# Patient Record
Sex: Male | Born: 1985 | Race: White | Hispanic: No | Marital: Single | Smoking: Never smoker
Health system: Southern US, Academic
[De-identification: ages and names within clinical notes are randomized; demographics above are authoritative.]

---
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
## 2022-01-15 ENCOUNTER — Encounter (HOSPITAL_COMMUNITY): Payer: Self-pay

## 2022-01-15 ENCOUNTER — Ambulatory Visit (HOSPITAL_COMMUNITY): Payer: Self-pay

## 2022-02-06 ENCOUNTER — Ambulatory Visit (HOSPITAL_COMMUNITY)
Admission: RE | Admit: 2022-02-06 | Discharge: 2022-02-06 | Disposition: A | Discharge status: Still In Hospital | Payer: Auto Insurance (includes no fault) | Source: Ambulatory Visit

## 2022-02-06 ENCOUNTER — Other Ambulatory Visit: Payer: Self-pay

## 2022-02-06 DIAGNOSIS — S161XXD Strain of muscle, fascia and tendon at neck level, subsequent encounter: Secondary | ICD-10-CM | POA: Insufficient documentation

## 2022-02-06 DIAGNOSIS — M5416 Radiculopathy, lumbar region: Secondary | ICD-10-CM | POA: Insufficient documentation
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
## 2022-02-12 ENCOUNTER — Other Ambulatory Visit: Payer: Self-pay

## 2022-02-12 ENCOUNTER — Ambulatory Visit (HOSPITAL_COMMUNITY)
Admission: RE | Admit: 2022-02-12 | Discharge: 2022-02-12 | Disposition: A | Discharge status: Still In Hospital | Payer: Auto Insurance (includes no fault) | Source: Ambulatory Visit

## 2022-02-12 NOTE — PT Treatment (Signed)
[facility]  Outpatient Physical Therapy  [place], [address]  [phone]  [phone]    Physical Therapy Treatment Note    Date: 02/12/2022  Patient's Name: Jacob Scott.  Date of Birth: [birth year]            Visit #/POC: 2 / up to 33  Authorization:  POC Signed?:  yes  POC Ends: 10 / 21/23  Next Progress Note Due:       Evaluating Physical Therapist: Darrol Jump MSPT  PT diagnosis/Reason for Referral: M54.16 Lumbar radiculopathy  S16.1XXA Cervical strain, acute, initial encounter  Next Scheduled Physician Appointment: no follow up appointment scheduled.   Allergies/Contraindications:           Subjective: Rates pain 7/10 today.  States painful entire spine.  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  No changes after evaluation last visit.  States he does have trouble sleeping.     Objective: Warm up on Nustep followed by activities noted below.        EXERCISE/ACTIVITY NAME REPETITIONS RESISTANCE COMPLETED THIS DOS   Nustep    5 min Level 3 yes    Supine trunk rotation with anterior chest stretch   10 x 10 sec each side na Yes; HEP 02/12/22   Supine chin tuck    Supine scalene stretch   5 sec x 10     5 sec x 10 na Yes; HEP 02/12/22  Yes; HEP 02/12/22   Open book L/R   10 each sidelying na Yes; HEP 02/12/22   Arm on ball for side glide and rotations   10 x each na yes   Seated trunk extension over ball   10 na yes                             Access Code: [email or web address]  URL: [email or web address]  Date: 02/12/2022  Prepared by: Mearl Latin Shandon Matson    Exercises  - Supine Posterior Scalene Stretch  - 2 x daily - 7 x weekly - 1 sets - 10 reps - 10 sec hold  - Supine Chin Tuck  - 2 x daily - 7 x weekly - 1 sets - 10 reps - 10 sec hold  - Supine Lower Trunk Rotation  - 2 x daily - 7 x weekly - 1 sets - 10 reps - 10 sec hold  - Sidelying Thoracic Rotation with Open Book  - 2 x daily - 7 x weekly - 1 sets - 10 reps - 10 sec hold    Assessment: Pt reports no  change in pain after session.  Transitions are smooth but slow.  Pt with very flat affect but does report pain with activity.  Good ROM noted with stretches but pt does report pain.     Plan: Will continue and progress as tolerated.  Pt does note he may only be able to attend therapy 1x/week d/t his work schedule        Short Term Goals: 3 Weeks              - Pt will participate in a HEP agreed upon in therapy.              - Pt will report pain has localized to a particular cervical spinal level, thoracic level, and/or lumbar level.              - Pt will report  no pain (only tenderness) with palpation of muscles surrounding the cervical, thoracic, and lumbar area.              - Pt will report an improvement in his functional skills by an improve in his PSFS average to 5.        Long Term Goals: 6  Weeks              - Pt will participate in all therapy activities in the clinic with pain level increasing no greater than 2 points from the time he starts therapy to the end of the session.              - Pt will report pain has localized to the lumbar spine and reports no radiculopathy into his legs.              - Pt will report no pain (only tenderness) with palpation of muscles surrounding the cervical, thoracic, and lumbar are              - Pt will report an improvement in his functional skills by an improve in his PSFS average to 7.              - Pt will improve his MMT of all muscle groups including UE and LE bilaterally to 4/5 without pain.        Total Session Time 30 and Timed code minutes 30  THERAPEUTIC EXERCISE 30 minutes      Rye Decoste, PTA  02/12/2022, 17:09

## 2022-02-14 ENCOUNTER — Ambulatory Visit (HOSPITAL_COMMUNITY): Payer: Self-pay

## 2022-02-21 ENCOUNTER — Ambulatory Visit (HOSPITAL_COMMUNITY): Payer: Self-pay

## 2022-02-23 ENCOUNTER — Ambulatory Visit (HOSPITAL_COMMUNITY): Payer: Self-pay

## 2022-03-02 ENCOUNTER — Other Ambulatory Visit: Payer: Self-pay

## 2022-03-02 ENCOUNTER — Ambulatory Visit
Admission: RE | Admit: 2022-03-02 | Discharge: 2022-03-02 | Disposition: A | Discharge status: Still In Hospital | Payer: Auto Insurance (includes no fault) | Source: Ambulatory Visit

## 2022-03-02 NOTE — PT Treatment (Signed)
[facility] Medicine [facility]  Outpatient Physical Therapy  [address]  [place], [address]  [phone]  (Fax) [phone]    Physical Therapy Treatment Note    Date: 03/02/2022  Patient's Name: Jacob Scott.  Date of Birth: [birth year]              Visit #/POC: 3 / up to 12  Authorization:  POC Signed?:  yes  POC Ends: 10 / 31/23  Next Progress Note Due:         Evaluating Physical Therapist: Oliver Hum MSPT  PT diagnosis/Reason for Referral: M54.16 Lumbar radiculopathy  S16.1XXA Cervical strain, acute, initial encounter  Next Scheduled Physician Appointment: no follow up appointment scheduled.   Allergies/Contraindications:         Subjective: States exercise has made him worse.  Notes he is trying to do exercises at home but notes they do cause pain.  Pt states he continues to work and that causes pain as well.  Does not have follow up appointment with doctor at this time.  States has missed almost 3 weeks of therapy d/t work.  Rates pain 8/10 today.  Notes neck, upper and lower back pain    Objective: Warm up on Nustep.  Review of HEP, discussed importance of consistent attendance and importance of completing HEP      AROM lumbar spine                                                              03/02/22    right left  R    /   L   Flexion 21 cm to the floor  25 cm to floor   Extension 10 degrees  15 degrees   Sidebend 54 inches from floor 51.5 cm to the floor  49cm /  51cm      AROM cervical spine                                                                03/02/22    right left    R  /   L   Flexion 25 degrees      35   Extension 54 degrees       45   Sidebend 25 degrees 25 degrees   30 /  38   Rotation 55 degrees 50 degrees   45 /  50        EXERCISE/ACTIVITY NAME REPETITIONS RESISTANCE COMPLETED THIS DOS   Nustep     5 min Level 3 yes    Supine trunk rotation with anterior chest stretch    10 x 10 sec each side Na (cues) Yes; HEP 02/12/22   Supine chin tuck     Supine  scalene stretch    5 sec x 10      5 sec x 10 Na (cues) Yes; HEP 02/12/22  Yes; HEP 02/12/22   Open book L/R    10 each sidelying Na (cues Yes; HEP 02/12/22   Arm on ball for side glide and rotations  10 x each na  no   Seated trunk extension over ball    10 na  no                                               Assessment: Pt did come into therapy today straight from work.  ROM noted to have some improvement however some measurements were worse.  Pt has smooth transitions and minimal pain behaviors.  Of note he cannot recall correct form for any of his home exercises.  Poor attendance does contribute to his limited progress.      Short Term Goals: 3 Weeks              - Pt will participate in a HEP agreed upon in therapy.              - Pt will report pain has localized to a particular cervical spinal level, thoracic level, and/or lumbar level.              - Pt will report no pain (only tenderness) with palpation of muscles surrounding the cervical, thoracic, and lumbar area.              - Pt will report an improvement in his functional skills by an improve in his PSFS average to 5.        Long Term Goals: 6  Weeks              - Pt will participate in all therapy activities in the clinic with pain level increasing no greater than 2 points from the time he starts therapy to the end of the session.              - Pt will report pain has localized to the lumbar spine and reports no radiculopathy into his legs.              - Pt will report no pain (only tenderness) with palpation of muscles surrounding the cervical, thoracic, and lumbar are              - Pt will report an improvement in his functional skills by an improve in his PSFS average to 7.              - Pt will improve his MMT of all muscle groups including UE and LE bilaterally to 4/5 without pain.             Plan:   PT will need to reassess pt to determine need to extend POC    Total Session Time 30 and Timed code minutes 30  THERAPEUTIC EXERCISE 30  minutes      Hevin Jeffcoat, PTA  03/02/2022, 17:09

## 2022-03-12 ENCOUNTER — Ambulatory Visit (HOSPITAL_COMMUNITY): Payer: Self-pay

## 2022-03-15 ENCOUNTER — Ambulatory Visit (HOSPITAL_COMMUNITY): Payer: Self-pay

## 2022-03-20 ENCOUNTER — Other Ambulatory Visit: Payer: Self-pay

## 2022-03-20 ENCOUNTER — Ambulatory Visit
Admission: RE | Admit: 2022-03-20 | Discharge: 2022-03-20 | Disposition: A | Discharge status: Still In Hospital | Payer: Self-pay | Source: Ambulatory Visit

## 2022-03-20 DIAGNOSIS — S161XXD Strain of muscle, fascia and tendon at neck level, subsequent encounter: Secondary | ICD-10-CM | POA: Insufficient documentation

## 2022-03-20 DIAGNOSIS — M5416 Radiculopathy, lumbar region: Secondary | ICD-10-CM | POA: Insufficient documentation

## 2022-03-20 NOTE — PT Treatment (Signed)
[facility]  Outpatient Physical Therapy  [place], [address]  [phone]  [phone]    Physical Therapy Treatment Note  DISCHARGE SUMMARY    Date: 03/20/2022  Patient's Name: Jacob Scott.  Date of Birth: 03/02/1986    Visit #/POC: 4/ up to 12  Authorization:  POC Signed?:  yes  POC Ends: 10 / 31/23  Next Progress Note Due: 11/31/2023     Evaluating Physical Therapist: Darrol Jump MSPT  PT diagnosis/Reason for Referral: M54.16 Lumbar radiculopathy  S16.1XXA Cervical strain, acute, initial encounter  Next Scheduled Physician Appointment: no follow up appointment scheduled.   Allergies/Contraindications:          Subjective: Pt reports that it hurts when he tries to do his exercises but the exercises hurt.  He tries to do the exercises in the evenings when he gets home from work.   He misses a lot of therapy d/t work.  Rates pain 8/10 today.  Notes neck, upper and lower back pain.  Pt attended the evaluation on 02/06/2022, once the week of 02/12/2022, did not attend the next week, attended once the week of 02/26/2022, and then returned for therapy today.  Pt likes the MHP and e-stim but today is the first session we have done that this episode of care.     Patient-Specific Functional Score:  Problem Score at eval 03/20/2022   1. Drive without pain 3 3   2. Work at a normal pace 3 3   3. Sleep all night 3 3   Total 3 3         Objective:     AROM lumbar spine   At evaluation 03/02/2022 03/20/2022     right left     Flexion 21 cm to the floor 25 cm to floor 26 cm to floor    Extension 10 degrees 15 degrees 14 degrees   Sidebend 54 inches from floor 51.5 cm to the floor R = 49 cm   L = 51cm B each side = 49 cm to the floor         AROM cervical spine   At evaluation 03/02/2022 03/20/2022     right left     Flexion 25 degrees   35 degrees 18 degrees   Extension 54 degrees    45 degrees 48 degrees   Sidebend 25 degrees 25 degrees R = 30 degrees  L =  38 degrees R = 25 degrees L = 20 degrees   Rotation 55 degrees 50 degrees R= 45 degrees  L = 50 degrees R = 60 degrees  L = 50 degrees      ROM comments (at the evaluation and today) -Pt reports all active movements hurt in the middle of his neck and the middle of his low back     Strength   At evaluation  03/20/2022     right left     Cervical flexion 3/5  3/5   Cervical extension 3/5  3/5   Cervical side bending 3/5 3/5  B = 3/5   Cervical rotation  3/5 3/5  B = 3/5   Shoulder flexion 3/5 3/5  B = 3/5   Shoulder abduction 3/5 3/5  B = 3+/5   Hip flexion (L1,2) 3/5 3/5  B = 3/5   Knee flexion (S1) 3/5 3/5  B = 4/5   Knee extension (L2-4) 3/5 3/5  B = 3+/5  At evaluation and today - Strength comments pain with all resisted manual muscle tests.     Palpation at evaluation : pain in all the following muscle groups including scalenes, levator, pec, upper trap, deltoids, rhomboids, cervical paraspinals, thoracic paraspinals, and lumbar paraspinals.  Pain with palpation of c-spine all levels, thoracic spine all levels, and lumbar spine all levels including spinous processes and transverse processes.  Pain with palpation of B PSIS.       EXERCISE/ACTIVITY NAME REPETITIONS RESISTANCE COMPLETED THIS DOS   Nustep     5 min Level 3 no    Supine trunk rotation with anterior chest stretch    10 x 10 sec each side Na (cues) no; HEP 02/12/22   Supine chin tuck     Supine scalene stretch    5 sec x 10      5 sec x 10 Na (cues) no; HEP 02/12/22  Yes; HEP 02/12/22   Open book L/R    10 each sidelying Na (cues no; HEP 02/12/22   Arm on ball for side glide and rotations    10 x each na  no   Seated trunk extension over ball    10 na  no    Reassess for progress note         yes    MHP and pre-modulated e-stim                               Assessment: Pt did come into therapy today straight from work.  ROM noted to be about the same and MMT was slightly improved.   PROGRESS NOTE - Pt has attended 4/12 visits in the past 7 weeks.  Pt  has made little to no progress.  Pt has met 0/4 STGs and 0/5 LTGs.  Pt reports little to no improvement since initial evaluation.  PT and pt discussed that PT services do not appear to be helping pt at this time.  Pt will be discharged due to no change at this time.      Short Term Goals: 3 Weeks              - Pt will participate in a HEP agreed upon in therapy.  PROGRESSING, 03/20/2022              - Pt will report pain has localized to a particular cervical spinal level, thoracic level, and/or lumbar level. NOT MET, 03/20/2022              - Pt will report no pain (only tenderness) with palpation of muscles surrounding the cervical, thoracic, and lumbar area. NOT MET, 03/20/2022              - Pt will report an improvement in his functional skills by an improve in his PSFS average to 5.  NOT MET, 03/20/2022        Long Term Goals: 6  Weeks              - Pt will participate in all therapy activities in the clinic with pain level increasing no greater than 2 points from the time he starts therapy to the end of the session. NOT MET, 03/20/2022              - Pt will report pain has localized to the lumbar spine and reports no radiculopathy into his legs.  NOT MET, 03/20/2022              -  Pt will report no pain (only tenderness) with palpation of muscles surrounding the cervical, thoracic, and lumbar are NOT MET, 03/20/2022              - Pt will report an improvement in his functional skills by an improve in his PSFS average to 7. NOT MET, 03/20/2022              - Pt will improve his MMT of all muscle groups including UE and LE bilaterally to 4/5 without pain. NOT MET, 03/20/2022        Plan:   Discharge at this time due to no change since initial evaluation.  Pt will discuss next steps with his referring doctor.  If that doctor recommends additional PT, recommend doing a new evaluation and plan of care at that time since this POC expires as of today.     Total Session Time 45, Timed code minutes 25, and Untimed  code minutes 20  THERAPEUTIC EXERCISE 25 minutes and ELECTRICAL STIMULATION 20 minutes    Darrol Jump  03/20/2022, 16:40
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
## 2023-03-25 ENCOUNTER — Inpatient Hospital Stay (HOSPITAL_COMMUNITY)
Admission: RE | Admit: 2023-03-25 | Discharge: 2023-03-25 | Disposition: A | Discharge status: Home / Self Care | Payer: BC Managed Care – PPO | Source: Ambulatory Visit

## 2023-03-25 ENCOUNTER — Other Ambulatory Visit: Payer: Self-pay

## 2023-03-25 ENCOUNTER — Other Ambulatory Visit (HOSPITAL_COMMUNITY): Payer: Self-pay | Admitting: Orthopaedic Surgery

## 2023-03-25 ENCOUNTER — Emergency Department
Admission: EM | Admit: 2023-03-25 | Discharge: 2023-03-25 | Disposition: A | Discharge status: Home / Self Care | Payer: BC Managed Care – PPO | Source: Home / Self Care | Attending: Physician Assistant | Admitting: Physician Assistant

## 2023-03-25 ENCOUNTER — Emergency Department (HOSPITAL_COMMUNITY): Payer: BC Managed Care – PPO

## 2023-03-25 ENCOUNTER — Other Ambulatory Visit (HOSPITAL_COMMUNITY): Payer: BC Managed Care – PPO

## 2023-03-25 DIAGNOSIS — W010XXA Fall on same level from slipping, tripping and stumbling without subsequent striking against object, initial encounter: Secondary | ICD-10-CM | POA: Insufficient documentation

## 2023-03-25 DIAGNOSIS — Z01818 Encounter for other preprocedural examination: Secondary | ICD-10-CM | POA: Insufficient documentation

## 2023-03-25 DIAGNOSIS — Z01811 Encounter for preprocedural respiratory examination: Secondary | ICD-10-CM | POA: Insufficient documentation

## 2023-03-25 DIAGNOSIS — S52322A Displaced transverse fracture of shaft of left radius, initial encounter for closed fracture: Secondary | ICD-10-CM | POA: Insufficient documentation

## 2023-03-25 LAB — CBC
HCT: 37.3 % (ref 36.7–47.1)
HGB: 13.1 g/dL (ref 12.5–16.3)
MCH: 29.3 pg (ref 23.8–33.4)
MCHC: 35.1 g/dL (ref 32.5–36.3)
MCV: 83.4 fL (ref 73.0–96.2)
MPV: 8.4 fL (ref 7.4–11.4)
PLATELETS: 231 10*3/uL (ref 140–440)
RBC: 4.46 10*6/uL (ref 4.06–5.63)
RDW: 13.5 % (ref 12.1–16.2)
WBC: 10.1 10*3/uL (ref 3.6–10.2)

## 2023-03-25 LAB — URINALYSIS, MACROSCOPIC
BILIRUBIN: NEGATIVE mg/dL
BLOOD: NEGATIVE mg/dL
GLUCOSE: NEGATIVE mg/dL
KETONES: NEGATIVE mg/dL
LEUKOCYTES: NEGATIVE WBCs/uL
NITRITE: NEGATIVE
PH: 6 (ref 5.0–9.0)
PROTEIN: 10 mg/dL
SPECIFIC GRAVITY: 1.031 — ABNORMAL HIGH (ref 1.002–1.030)
UROBILINOGEN: 2 mg/dL — AB

## 2023-03-25 LAB — BASIC METABOLIC PANEL
ANION GAP: 6 mmol/L (ref 4–13)
BUN/CREA RATIO: 13 (ref 6–22)
BUN: 16 mg/dL (ref 7–25)
CALCIUM: 9.2 mg/dL (ref 8.6–10.3)
CHLORIDE: 102 mmol/L (ref 98–107)
CO2 TOTAL: 29 mmol/L (ref 21–31)
CREATININE: 1.27 mg/dL (ref 0.60–1.30)
ESTIMATED GFR: 76 mL/min/{1.73_m2} (ref >59)
GLUCOSE: 107 mg/dL (ref 74–109)
OSMOLALITY, CALCULATED: 276 mosm/kg (ref 270–290)
POTASSIUM: 4.2 mmol/L (ref 3.5–5.1)
SODIUM: 137 mmol/L (ref 136–145)

## 2023-03-25 LAB — URINALYSIS, MICROSCOPIC
RBCS: <1 /[HPF] (ref <4)
WBCS: 2 /[HPF] (ref <6)

## 2023-03-25 MED ORDER — HYDROMORPHONE 2 MG/ML INJECTION WRAPPER
INJECTION | INTRAMUSCULAR | Status: AC
Start: 2023-03-25 — End: 2023-03-25
  Filled 2023-03-25: qty 1

## 2023-03-25 MED ORDER — HYDROMORPHONE 2 MG/ML INJECTION WRAPPER
0.5000 mg | INJECTION | INTRAMUSCULAR | Status: AC
Start: 2023-03-25 — End: 2023-03-25
  Administered 2023-03-25: 0.5 mg via INTRAVENOUS

## 2023-03-25 MED ORDER — ONDANSETRON HCL (PF) 4 MG/2 ML INJECTION SOLUTION
4.0000 mg | INTRAMUSCULAR | Status: AC
Start: 2023-03-25 — End: 2023-03-25
  Administered 2023-03-25: 4 mg via INTRAVENOUS

## 2023-03-25 MED ORDER — ONDANSETRON HCL (PF) 4 MG/2 ML INJECTION SOLUTION
INTRAMUSCULAR | Status: AC
Start: 2023-03-25 — End: 2023-03-25
  Filled 2023-03-25: qty 2

## 2023-03-25 MED ORDER — HYDROMORPHONE 2 MG/ML INJECTION WRAPPER
1.0000 mg | INJECTION | INTRAMUSCULAR | Status: AC
Start: 2023-03-25 — End: 2023-03-25
  Administered 2023-03-25: 1 mg via INTRAVENOUS

## 2023-03-25 NOTE — ED Nurses Note (Signed)
Sugar tong splint applied to left forearm with sling. Patient tolerated without complaint.

## 2023-03-25 NOTE — Discharge Instructions (Signed)
Go straight to Dr. Roderic Ovens office for further evaluation and so he can set you up for surgery tomorrow.  Return to the ER for other emergencies or as needed

## 2023-03-25 NOTE — ED Provider Notes (Signed)
Emergency Medicine      Name: Jacob Scott.  Age and Gender: 37 y.o. male  Date of Birth: 08-Aug-1985  MRN: [id number]  PCP: No Pcp    CC:  Chief Complaint   Patient presents with    Fall       HPI:  Jacob Scott. is a 37 y.o. White male who presents to the ER with left forearm pain.  Patient states he tripped and fell early this morning around 4:30 a.m. and attempted to catch himself with his left hand.  He reports pain of the left forearm since that time and difficulty moving his fingers secondary to pain.    Below pertinent information reviewed with patient:  No past medical history on file.        No Known Allergies    No past surgical history on file.     Social History     Socioeconomic History    Marital status: Single       ROS:  No other overt positive review of systems are noted other than stated in the HPI.      Objective:    ED Triage Vitals [03/25/23 0810]   BP (Non-Invasive) (!) 156/96   Heart Rate 86   Respiratory Rate 20   Temperature 36.8 C (98.3 F)   SpO2 100 %   Weight 81.6 kg (180 lb)   Height 1.798 m (5' 10.8")     Filed Vitals:    03/25/23 0810   BP: (!) 156/96   Pulse: 86   Resp: 20   Temp: 36.8 C (98.3 F)   SpO2: 100%       Nursing notes and vital signs reviewed.    Constitutional - No acute distress.  Alert and Active.  HEENT - Normocephalic. Conjunctiva clear. Moist mucous membranes.   Neck - Trachea midline. No stridor. No hoarseness.  Cardiac - Regular rate and rhythm. No murmurs, rubs, or gallops. Intact distal pulses.  Respiratory/Chest - Normal respiratory effort. Clear to auscultation bilaterally. No rales, wheezes or rhonchi.   Musculoskeletal - Good AROM. Tenderness of the distal forearm with associated edema. Distal radial and ulnar pulses are intact. Patient has intact sensation of the fingers and has active flexion and extension of the fingers.  Skin - Warm and dry, without any rashes or other lesions.  Neuro - Alert and oriented x 3. Moving all extremities  symmetrically. Normal gait.  Psych - Normal mood and affect. Behavior is normal            Any pertinent labs and imaging obtained during this encounter reviewed below in MDM.    MDM/ED Course:      Medical Decision Making  Patient presents to the ER with left forearm pain.  Patient states he tripped and fell early this morning around 4:30 a.m. and attempted to catch himself with his left hand.  He reports pain of the left forearm since that time and difficulty moving his fingers secondary to pain.  Differential diagnoses include fracture, sprain, contusion.  Patient underwent diagnostics and was found to have a displaced distal radial shaft fracture.  He has moderate edema your the left wrist, but has intact pulses, intact distal sensation and intact motor movement distal to the injury.  Dr. Smitty Cords consulted who asked that we place the patient in a well-padded sugar-tong splint and have him come to the office today.  Patient medicated with Dilaudid for pain and will be discharged to Dr.  Higinbotham office for further evaluation and to have surgery scheduled for tomorrow.    Amount and/or Complexity of Data Reviewed  Radiology: ordered.    Risk  Prescription drug management.  Parenteral controlled substances.              ED Course as of 03/25/23 1016   Mon Mar 25, 2023   0906 Discussed case with Dr Smitty Cords who asks that we place patient in a well padded sugar tong splint and have him come to the office, so he can arrange for surgery tomorrow.   0908 XR FOREARM LEFT  Displaced distal radial shaft fracture       Orders Placed This Encounter    BEDSIDE  SPLINT APPLICATION    CANCELED: XR WRIST LEFT 2 VIEW    XR WRIST LEFT    XR FOREARM LEFT    HYDROmorphone (DILAUDID) 2 mg/mL injection    ondansetron (ZOFRAN) 2 mg/mL injection    HYDROmorphone (DILAUDID) 2 mg/mL injection         Any procedures:  Splint/Cast    Date/Time: 03/25/2023 10:16 AM    Performed by: Bud Face, [facility]  Authorized by: Maryagnes Amos, PA-C    Consent:     Consent obtained:  Verbal    Consent given by:  Patient    Risks, benefits, and alternatives were discussed: yes      Risks discussed:  Pain  Universal protocol:     Procedure explained and questions answered to patient or proxy's satisfaction: yes      Relevant documents present and verified: yes      Test results available: yes      Imaging studies available: yes      Required blood products, implants, devices, and special equipment available: yes    Pre-procedure details:     Distal neurologic exam:  Normal    Distal perfusion: distal pulses strong and brisk capillary refill    Post-procedure details:     Distal neurologic exam:  Unchanged    Distal perfusion: brisk capillary refill      Procedure completion:  Tolerated      Impression:   Clinical Impression   Closed displaced transverse fracture of shaft of left radius, initial encounter (Primary)       Disposition: Discharged    / Maryagnes Amos PA-C  03/25/2023, 09:01  [facility]  Department of Emergency Medicine  [facility]    Portions of this note may have been dictated using voice recognition software.     -----------------------  No results found for this or any previous visit (from the past 12 hour(s)).  XR FOREARM LEFT   Final Result   Displaced distal radial shaft fracture             Radiologist location ID: [id number]         XR WRIST LEFT   Final Result   Addendum (preliminary) 1 of 1   ADDENDUM:   This is a correction to the body the report, second sentence.   this sentence should read:   There is some anterior displacement and posterior angulation of the distal    fracture fragment as well as some overriding.         Radiologist location ID: [id number]         Final   Displaced radial shaft fracture                Radiologist location ID: [id number]

## 2023-03-25 NOTE — ED Nurses Note (Signed)
Patient discharged to home at this time with family. Discharge instructions provided and reviewed, verbalized understanding.

## 2023-03-25 NOTE — ED Triage Notes (Signed)
Fall this am left wrist injury , states tripped .

## 2023-03-26 ENCOUNTER — Ambulatory Visit (HOSPITAL_COMMUNITY): Payer: BC Managed Care – PPO | Admitting: Anesthesiology

## 2023-03-26 ENCOUNTER — Encounter (HOSPITAL_COMMUNITY): Payer: Self-pay | Admitting: Orthopaedic Surgery

## 2023-03-26 ENCOUNTER — Ambulatory Visit (HOSPITAL_COMMUNITY): Payer: BC Managed Care – PPO

## 2023-03-26 ENCOUNTER — Other Ambulatory Visit: Payer: Self-pay

## 2023-03-26 ENCOUNTER — Encounter (HOSPITAL_COMMUNITY)
Admission: RE | Disposition: A | Discharge status: Home / Self Care | Payer: Self-pay | Source: Ambulatory Visit | Attending: Orthopaedic Surgery

## 2023-03-26 ENCOUNTER — Ambulatory Visit
Admission: RE | Admit: 2023-03-26 | Discharge: 2023-03-26 | Disposition: A | Discharge status: Home / Self Care | Payer: BC Managed Care – PPO | Source: Ambulatory Visit | Attending: Orthopaedic Surgery | Admitting: Orthopaedic Surgery

## 2023-03-26 DIAGNOSIS — W19XXXA Unspecified fall, initial encounter: Secondary | ICD-10-CM | POA: Insufficient documentation

## 2023-03-26 DIAGNOSIS — S52352A Displaced comminuted fracture of shaft of radius, left arm, initial encounter for closed fracture: Secondary | ICD-10-CM | POA: Insufficient documentation

## 2023-03-26 SURGERY — OPEN REDUCTION INTERNAL FIXATION FRACTURE RADIUS
Anesthesia: General | Site: Wrist | Laterality: Left | Wound class: Clean Wound: Uninfected operative wounds in which no inflammation occurred

## 2023-03-26 MED ORDER — LACTATED RINGERS INTRAVENOUS SOLUTION
INTRAVENOUS | Status: DC
Start: 2023-03-26 — End: 2023-03-26

## 2023-03-26 MED ORDER — SODIUM CHLORIDE 0.9 % (FLUSH) INJECTION SYRINGE
3.0000 mL | INJECTION | INTRAMUSCULAR | Status: DC | PRN
Start: 2023-03-26 — End: 2023-03-26

## 2023-03-26 MED ORDER — SUGAMMADEX 100 MG/ML INTRAVENOUS SOLUTION
Freq: Once | INTRAVENOUS | Status: DC | PRN
Start: 2023-03-26 — End: 2023-03-26
  Administered 2023-03-26: 500 mg via INTRAVENOUS

## 2023-03-26 MED ORDER — ONDANSETRON HCL (PF) 4 MG/2 ML INJECTION SOLUTION
INTRAMUSCULAR | Status: AC
Start: 2023-03-26 — End: 2023-03-26
  Filled 2023-03-26: qty 2

## 2023-03-26 MED ORDER — OXYCODONE-ACETAMINOPHEN 5 MG-325 MG TABLET
1.0000 | ORAL_TABLET | ORAL | Status: DC | PRN
Start: 2023-03-26 — End: 2023-03-26
  Administered 2023-03-26: 1 via ORAL
  Filled 2023-03-26: qty 1

## 2023-03-26 MED ORDER — ALBUTEROL SULFATE 2.5 MG/3 ML (0.083 %) SOLUTION FOR NEBULIZATION
2.5000 mg | INHALATION_SOLUTION | Freq: Once | RESPIRATORY_TRACT | Status: DC | PRN
Start: 2023-03-26 — End: 2023-03-26

## 2023-03-26 MED ORDER — FAMOTIDINE (PF) 20 MG/2 ML INTRAVENOUS SOLUTION
INTRAVENOUS | Status: AC
Start: 2023-03-26 — End: 2023-03-26
  Filled 2023-03-26: qty 2

## 2023-03-26 MED ORDER — SODIUM CHLORIDE 0.9 % (FLUSH) INJECTION SYRINGE
3.0000 mL | INJECTION | Freq: Three times a day (TID) | INTRAMUSCULAR | Status: DC
Start: 2023-03-26 — End: 2023-03-26

## 2023-03-26 MED ORDER — FENTANYL (PF) 50 MCG/ML INJECTION WRAPPER
50.0000 ug | INJECTION | INTRAMUSCULAR | Status: AC | PRN
Start: 2023-03-26 — End: 2023-03-26
  Administered 2023-03-26 (×4): 50 ug via INTRAVENOUS

## 2023-03-26 MED ORDER — IPRATROPIUM 0.5 MG-ALBUTEROL 3 MG (2.5 MG BASE)/3 ML NEBULIZATION SOLN
3.0000 mL | INHALATION_SOLUTION | Freq: Once | RESPIRATORY_TRACT | Status: DC | PRN
Start: 2023-03-26 — End: 2023-03-26

## 2023-03-26 MED ORDER — DEXMEDETOMIDINE 100 MCG/ML INTRAVENOUS SOLUTION
INTRAVENOUS | Status: AC
Start: 2023-03-26 — End: 2023-03-26
  Filled 2023-03-26: qty 2

## 2023-03-26 MED ORDER — FENTANYL (PF) 50 MCG/ML INJECTION SOLUTION
INTRAMUSCULAR | Status: AC
Start: 2023-03-26 — End: 2023-03-26
  Filled 2023-03-26: qty 2

## 2023-03-26 MED ORDER — ALBUTEROL SULFATE HFA 90 MCG/ACTUATION AEROSOL INHALER
INHALATION_SPRAY | Freq: Once | RESPIRATORY_TRACT | Status: DC | PRN
Start: 2023-03-26 — End: 2023-03-26
  Administered 2023-03-26 (×2): 3 via RESPIRATORY_TRACT

## 2023-03-26 MED ORDER — ONDANSETRON HCL (PF) 4 MG/2 ML INJECTION SOLUTION
4.0000 mg | Freq: Once | INTRAMUSCULAR | Status: DC | PRN
Start: 2023-03-26 — End: 2023-03-26

## 2023-03-26 MED ORDER — DEXMEDETOMIDINE 100 MCG/ML INTRAVENOUS SOLUTION
Freq: Once | INTRAVENOUS | Status: DC | PRN
Start: 2023-03-26 — End: 2023-03-26
  Administered 2023-03-26: 20 ug via INTRAVENOUS

## 2023-03-26 MED ORDER — ROCURONIUM 10 MG/ML INTRAVENOUS SYRINGE WRAPPER
INJECTION | Freq: Once | INTRAVENOUS | Status: DC | PRN
Start: 2023-03-26 — End: 2023-03-26
  Administered 2023-03-26: 40 mg via INTRAVENOUS
  Administered 2023-03-26 (×2): 30 mg via INTRAVENOUS

## 2023-03-26 MED ORDER — HYDROMORPHONE 2 MG/ML INJECTION WRAPPER
1.0000 mg | INJECTION | INTRAMUSCULAR | Status: DC | PRN
Start: 2023-03-26 — End: 2023-03-26
  Administered 2023-03-26: 1 mg via INTRAVENOUS
  Filled 2023-03-26: qty 1

## 2023-03-26 MED ORDER — MIDAZOLAM 5 MG/ML INJECTION WRAPPER
INTRAMUSCULAR | Status: AC
Start: 2023-03-26 — End: 2023-03-26
  Filled 2023-03-26: qty 1

## 2023-03-26 MED ORDER — SODIUM CHLORIDE 0.9 % INTRAVENOUS PIGGYBACK
2.0000 g | Freq: Once | INTRAVENOUS | Status: AC
Start: 2023-03-26 — End: 2023-03-26
  Administered 2023-03-26: 2 g via INTRAVENOUS

## 2023-03-26 MED ORDER — MIDAZOLAM 5 MG/ML INJECTION WRAPPER
2.0000 mg | Freq: Once | INTRAMUSCULAR | Status: DC | PRN
Start: 2023-03-26 — End: 2023-03-26
  Administered 2023-03-26: 2 mg via INTRAVENOUS

## 2023-03-26 MED ORDER — PROPOFOL 10 MG/ML IV BOLUS
INJECTION | Freq: Once | INTRAVENOUS | Status: DC | PRN
Start: 2023-03-26 — End: 2023-03-26
  Administered 2023-03-26 (×2): 100 mg via INTRAVENOUS
  Administered 2023-03-26: 200 mg via INTRAVENOUS

## 2023-03-26 MED ORDER — FAMOTIDINE (PF) 20 MG/2 ML INTRAVENOUS SOLUTION
20.0000 mg | Freq: Once | INTRAVENOUS | Status: AC
Start: 2023-03-26 — End: 2023-03-26
  Administered 2023-03-26: 20 mg via INTRAVENOUS

## 2023-03-26 MED ORDER — ONDANSETRON HCL (PF) 4 MG/2 ML INJECTION SOLUTION
4.0000 mg | Freq: Four times a day (QID) | INTRAMUSCULAR | Status: DC | PRN
Start: 2023-03-26 — End: 2023-03-26

## 2023-03-26 MED ORDER — LIDOCAINE (PF) 100 MG/5 ML (2 %) INTRAVENOUS SYRINGE
INJECTION | Freq: Once | INTRAVENOUS | Status: DC | PRN
Start: 2023-03-26 — End: 2023-03-26
  Administered 2023-03-26: 100 mg via INTRAVENOUS

## 2023-03-26 MED ORDER — FENTANYL (PF) 50 MCG/ML INJECTION WRAPPER
INJECTION | Freq: Once | INTRAMUSCULAR | Status: DC | PRN
Start: 2023-03-26 — End: 2023-03-26
  Administered 2023-03-26 (×2): 100 ug via INTRAVENOUS

## 2023-03-26 MED ORDER — KETOROLAC 30 MG/ML (1 ML) INJECTION SOLUTION
15.0000 mg | INTRAMUSCULAR | Status: AC
Start: 2023-03-26 — End: 2023-03-26
  Administered 2023-03-26: 15 mg via INTRAVENOUS
  Filled 2023-03-26: qty 1

## 2023-03-26 MED ORDER — ONDANSETRON HCL (PF) 4 MG/2 ML INJECTION SOLUTION
4.0000 mg | Freq: Once | INTRAMUSCULAR | Status: AC
Start: 2023-03-26 — End: 2023-03-26
  Administered 2023-03-26: 4 mg via INTRAVENOUS

## 2023-03-26 MED ORDER — PROCHLORPERAZINE EDISYLATE 10 MG/2 ML (5 MG/ML) INJECTION SOLUTION
5.0000 mg | Freq: Once | INTRAMUSCULAR | Status: DC | PRN
Start: 2023-03-26 — End: 2023-03-26

## 2023-03-26 MED ORDER — CEFAZOLIN 2 GRAM INTRAVENOUS SOLUTION
INTRAVENOUS | Status: AC
Start: 2023-03-26 — End: 2023-03-26
  Filled 2023-03-26: qty 14.71

## 2023-03-26 SURGICAL SUPPLY — 35 items
APPL 70% ISPRP 2% CHG 26ML CHLRPRP HI-LT ORNG PREP STRL LF  DISP CLR (MED SURG SUPPLIES) ×1 IMPLANT
BANDAGE COFLX 5YDX4IN STRL CHSV SLF ADH FOAM COMPRESS TAN LF (WOUND CARE SUPPLY) ×1 IMPLANT
BANDAGE ELT 5.8YDX4IN NONST SLFCLS ELAS KNIT TEAL END STCH VELCRO COTTON POLY BLND STD LGTH COMPRESS (WOUND CARE SUPPLY) ×1 IMPLANT
BANDAGE ELT 5.8YDX6IN NONST SLFCLS ELAS KNIT STRCH VELCRO COTTON POLY BLND STD LGTH COMPRESS BGE HNY (WOUND CARE SUPPLY) ×1 IMPLANT
BANDAGE ESMARK NV+ 9FTX3IN STRL COMPRESS LF  DISP (WOUND CARE SUPPLY) ×1 IMPLANT
BIT DRILL GLD 110MM 2.5MM QUICK COUPLE REPRO NONST (SURGICAL CUTTING SUPPLIES) ×1 IMPLANT
CONV USE 65308 - DRAPE FNFLD ABS REINF 77X53IN 43528 PRXM LF  STRL DISP SURG SMS 44X23IN (DRAPE/PACKS/SHEETS/OR TOWEL) ×1
CONV USE ITEM 307794 - DRAPE ABS REINF ELAS FEN CNTCT CLSR 146X110X75IN HAND PRXM LF  STRL DISP SURG SMS 42X18IN (DRAPE/PACKS/SHEETS/OR TOWEL) ×1
CONV USE ITEM 337687 - DRAPE REINF FNFLD 90X44IN LF  STRL DISP SURG (DRAPE/PACKS/SHEETS/OR TOWEL) ×2
DETERGENT INSTR 22OZ TRNSPT GEL RINSE FREE NEUT PH PREKLENZ CLR PLSNT LF (MISCELLANEOUS PT CARE ITEMS) ×1 IMPLANT
DISCONTINUED USE 162189 - BANDAGE ELT 5.8YDX6IN NONST SLFCLS ELAS KNIT STRCH VELCRO COTTON POLY BLND STD LGTH COMPRESS BGE HNY (WOUND CARE SUPPLY) ×1 IMPLANT
DRAPE ABS REINF ELAS FEN CNTCT CLSR 146X110X75IN HAND PRXM LF  STRL DISP SURG SMS 42X18IN (DRAPE/PACKS/SHEETS/OR TOWEL) ×1 IMPLANT
DRAPE CARM MBL XRY BAND 64X42IN LRG EQP RUB (DRAPE/PACKS/SHEETS/OR TOWEL) ×1 IMPLANT
DRAPE FNFLD ABS REINF 77X53IN 43528 PRXM LF  STRL DISP SURG SMS 44X23IN (DRAPE/PACKS/SHEETS/OR TOWEL) ×1 IMPLANT
DRAPE REINF FNFLD 90X44IN LF  STRL DISP SURG (DRAPE/PACKS/SHEETS/OR TOWEL) ×2 IMPLANT
DRESS PETRO 9X5IN CURAD XR COTTON NONADH OCL IMPREGNATE LF  STRL WHT (WOUND CARE SUPPLY) ×1 IMPLANT
ELECTRODE ESURG BLADE PNCL 15FT VLAB EDGE TELESCP SMOKE EVAC (SURGICAL CUTTING SUPPLIES) ×1 IMPLANT
ELECTRODE ESURG NEEDLE 4IN 3/32IN EDGE STRL .2IN DISP INSL STD SHAFT XTD LF (SURGICAL CUTTING SUPPLIES) ×1 IMPLANT
GLOVE SURG 7.5 LF  PF BEAD CUF STRL CRM 11.8IN PROTEXIS PI PLISPRN THK9.1 MIL (GLOVES AND ACCESSORIES) ×4 IMPLANT
GLOVE SURG 7.5 LTX PF SMOOTH BEAD CUF STRL YW 12IN PROTEXIS (GLOVES AND ACCESSORIES) ×2 IMPLANT
GLOVE SURG 8 LF  PF SMOOTH BEAD CUF INTLK STRL BLU 11.8IN PROTEXIS NEU-THERA PLISPRN THK7.9 MIL (GLOVES AND ACCESSORIES) ×1 IMPLANT
GOWN SURG LRG STD LGTH L3 HKLP CLSR RGLN SLEEVE TWL STRL LF  DISP GRN AERO BLU PRFRM FBRC (DRAPE/PACKS/SHEETS/OR TOWEL) ×1 IMPLANT
GOWN SURG XL STD LGTH L3 HKLP CLSR RGLN SLEEVE TWL STRL LF  DISP GRN AERO BLU PRFRM FBRC (DRAPE/PACKS/SHEETS/OR TOWEL) ×1 IMPLANT
GOWN SURG XL XLNG L4 REINF HKLP CLSR SET IN SLEEVE STRL LF  DISP BLU SIRUS SMS PE 56IN (DRAPE/PACKS/SHEETS/OR TOWEL) ×1 IMPLANT
PLATE LCP 98MM 7 H SS 3.5MM SCREW BONE (IMPLANTS TRAUMA) ×1 IMPLANT
SCREW BONE 3.5MM 16MM SLF TAP LOW PROF SM HEX SCKT SS CRTX 2.5MM F/T NONST SM FRAG SET (IMPLANTS TRAUMA) ×4 IMPLANT
SCREW BONE 3.5MM 6MM 18MM SLF TAP LOW PROF SM HEX SS CRTX NONST (IMPLANTS TRAUMA) IMPLANT
SPLINT ORTHO 15X4IN FST ST PLASTER OF PARIS SPCLST LF (ORTHOPEDICS (NOT IMPLANTS)) ×1 IMPLANT
SPONGE GAUZE 4X4IN MDCHC COTTON 12 PLY TY 7 LF  STRL DISP (WOUND CARE SUPPLY) ×1 IMPLANT
STAPLER SKIN 4.1X6.5MM 35 W STPL CART LF  APS U DISP CLR SS PLASTIC (WOUND CARE SUPPLY) ×1 IMPLANT
SUTURE 2-0 C-23 POLYSRB 30IN UNDYED BRD COAT ABS (SUTURE/WOUND CLOSURE) ×1 IMPLANT
SUTURE 2-0 X-1 VICRYL 27IN UNDYED BRD COAT ABS (SUTURE/WOUND CLOSURE) ×1 IMPLANT
SUTURE 4-0 PS2 MONOCRYL MTPS 18IN UNDYED MONOF ABS (SUTURE/WOUND CLOSURE) IMPLANT
TRAY SURG BSIN 50X50IN SRST BSIN TBL SFT LIGHT HNDL CVR TWL STRL LF (CUSTOM TRAYS & PACK) ×1 IMPLANT
WOUND IRRG IRRISEPT DBRD CLNSG 0.05% CHG SYSTEM STRL LF (WOUND CARE SUPPLY) ×1 IMPLANT

## 2023-03-26 NOTE — OR PreOp (Signed)
PATIENT BYPASSED PREOP HOLDING AREA, HELD BY PACU STAFF PRIOR TO ENTERING OR#7 FOR OPERATIVE CASE.

## 2023-03-26 NOTE — Nurses Notes (Signed)
Patient awaiting surgery. Complained of pain and asked to be medicated.  Spoke w/ Dr Suzette Battiest and per Dr Suzette Battiest, patient can have his home dose of Norco for pain.  Patient took home dose of Norco w/ sip of water.

## 2023-03-26 NOTE — Interval H&P Note (Signed)
[facility]      H&P UPDATE FORM                                                                                  Jacob Scott, Bones., 37 y.o. male  Date of Admission:  03/26/2023  Date of Birth:  07/16/1985    03/26/2023    STOP: IF H&P IS GREATER THAN 30 DAYS FROM SURGICAL DAY COMPLETE NEW H&P IS REQUIRED.     H & P updated the day of the procedure.  1.  H&P completed within 30 days of surgical procedure and has been reviewed within 24 hours of admission but prior to surgery or a procedure requiring anesthesia services, the patient has been examined, and no change has occured in the patients condition since the H&P was completed.       Change in medications: No              Comments:     2.  Patient continues to be appropriate candidate for planned surgical procedure. YES    [employer], DO

## 2023-03-26 NOTE — Anesthesia Transfer of Care (Signed)
ANESTHESIA TRANSFER OF CARE   Jacob Scott. is a 37 y.o. ,male,     had Procedure(s):  OPEN REDUCTION INTERNAL FIXATION LEFT RADIAL SHAFT USING SYNTHES IMPLANTS  performed  03/26/23   Primary Service: Manning Charity*    History reviewed. No pertinent past medical history.   Allergy History as of 03/26/23        No Known Allergies                  I completed my transfer of care / handoff to the receiving personnel during which we discussed:  Access, Airway, All key/critical aspects of case discussed, Analgesia, Antibiotics, Expectation of post procedure, Fluids/Product, Gave opportunity for questions and acknowledgement of understanding, Labs and PMHx      Post Location: PACU                                                             Last OR Temp: Temperature: 36.6 C (97.8 F)  ABG:  POTASSIUM   Date Value Ref Range Status   03/25/2023 4.2 3.5 - 5.1 mmol/L Final     KETONES   Date Value Ref Range Status   03/25/2023 Negative Negative, Trace mg/dL Final     CALCIUM   Date Value Ref Range Status   03/25/2023 9.2 8.6 - 10.3 mg/dL Final     Airway:* No LDAs found *  Blood pressure 122/74, pulse 91, temperature 36.6 C (97.8 F), resp. rate 19, SpO2 95%.

## 2023-03-26 NOTE — H&P (Signed)
Paper H&P attached to chart. See scanned document.

## 2023-03-26 NOTE — Anesthesia Postprocedure Evaluation (Signed)
Anesthesia Post Op Evaluation    Patient: Jacob Scott.  Procedure(s):  OPEN REDUCTION INTERNAL FIXATION LEFT RADIAL SHAFT USING SYNTHES IMPLANTS    Last Vitals:Temperature: 36.6 C (97.8 F) (03/26/23 1815)  Heart Rate: 64 (03/26/23 1845)  BP (Non-Invasive): 128/76 (03/26/23 1845)  Respiratory Rate: 20 (03/26/23 1845)  SpO2: 98 % (03/26/23 1845)    No notable events documented.    Patient is sufficiently recovered from the effects of anesthesia to participate in the evaluation and has returned to their pre-procedure level.  Patient location during evaluation: PACU       Patient participation: complete - patient participated  Level of consciousness: awake and alert and responsive to verbal stimuli    Pain management: adequate  Airway patency: patent    Anesthetic complications: no  Cardiovascular status: acceptable  Respiratory status: acceptable  Hydration status: acceptable  Patient post-procedure temperature: Pt Normothermic   PONV Status: Absent

## 2023-03-26 NOTE — OR Surgeon (Signed)
ORTHOPAEDIC SURGERY OPERATIVE REPORT:    Patient Name: Jacob Scott.  Date of Birth: 1986-05-07  Age: 37 y.o.  Gender: male  MRN: [id number]    Date of Surgery: 03/26/2023     Surgeon: Henriette Combs, DO    Preoperative Diagnosis:  Closed comminuted displaced left radial shaft fracture    Postoperative Diagnosis:  Same    Procedure(s):  Open reduction internal fixation left radial shaft    Type of Anesthesia: General    Anesthesia Staff: Anesthesiologist: Nydia Bouton, DO  CRNA: Rada Hay, CRNA   OR Staff: Circulator: Excell Seltzer, RN  PERIOPERATIVE CARE ASSISTANT: Abundio Miu, PCA  Scrub Person: Shrader, Loren Racer', ST; Dustin Folks, LPN  X-Ray Technologist: Sallyanne Kuster, RTR  Scrub First Assist: Doreene Adas, CST   Implants:  Synthes 6 hole 3.5 mm LCP plate, 6 appropriately-sized cortex screws    Estimated Blood Loss:  Less than 5 mL    Counts:     Sponge: Correct    Needle: Correct    Sharp: Correct    Instrument: Correct    Drains and/or Packs: None    Significant Events: None    Specimen(s) Removed: * No specimens in log *     Description of Findings:  Comminuted radial shaft fracture.  There was fairly significant soft tissue disruption at the fracture site.  Compartments remained soft and compressible throughout the case with brisk capillary refill and 2+ radial pulse    Post-Op Condition of Patient: Stable to PACU    Indications:     [name].  is a very pleasant  37 y.o. male  presenting to [facility] for ORIF of the left radial shaft.  Patient sustained injury yesterday after a fall and was seen in the emergency department.  He was splinted and then followed up in the orthopedics clinic yesterday.  Treatment options were discussed and we elected to proceed with operative intervention consisting of ORIF.      Procedure discussed in great detail.  Risks benefits potential complications and outcomes were discussed at length and patient verbalized understanding  and did provide written informed consent to proceed.    Procedure:     Seen and evaluated in the preoperative area.  H&P reviewed written consent confirmed posted on the chart.  Left arm marked as correct operative site.  Appropriate preoperative antibiotics administered.  Taken back to the operative suite transferred to the OR table in the supine position.  All bony prominences covered and padded appropriately.  Anesthesia then administered.  Once adequate anesthesia was achieved left upper extremity was taken out of the splint.  Compartments were soft and compressible.  2+ radial pulse with brisk capillary refill.  Left upper extremity then prepped and draped in normal sterile fashion.  Formal timeout performed.  Upon completion of timeout limb elevated tourniquet inflated.  At this time longitudinal incision on the volar aspect of the forearm centered over the fracture site was carried out.  Sharp dissection carried through skin and subcutaneous tissue.  Electrocautery used to achieve hemostasis.  Standard FCR approach was then carried out.  After we were deep to the FCR blunt dissection was used as I was very easily able to palpate directly down to the fracture site.  Retractors were placed and fracture site identified and cleared of any debris using combination of a curette and rongeur.  It was noted to be comminuted in nature.  Fracture site thoroughly irrigated using Irrisept solution.  Fracture was  then reduced appropriately in a point-to-point clamp was used to secure the reduction.  Plate was then positioned and fixed to the volar aspect of the radial shaft using 6 appropriately-sized cortex screws.  Screws were placed eccentrically to provide compression of the fracture site.  At this time biplanar fluoroscopy demonstrated satisfactory reduction and proper positioning of orthopedic hardware we then checked the wrist which was deemed stable with no apparent evidence at the DRUJ.  Wound was thoroughly  irrigated once again.  Closure was performed in layered fashion.  Subcutaneous tissue closed with 2-0 Vicryl in buried interrupted fashion.  Skin approximated using staples.  Incision then covered with Xeroform 4x4s and sterile Webril.  Patient then placed into a plaster sugar-tong splint using an Ace wrap.  Awoken from anesthesia and transferred back to the hospital gurney.  Was taken to PACU in stable and satisfactory condition.    Disposition:     Taken to PACU under the care of anesthesia in stable condition.  Stable for discharge home from an orthopedic perspective once cleared per PACU protocol.  Patient is to remain nonweightbearing to the operative extremity.  Maintain splint until follow-up.  Strict ice and elevation for the next 24-48 hours to alleviate pain and swelling.  Take pain medication as prescribed.  Follow up 2 weeks.  Signs and symptoms of compartment syndrome were reviewed at length both patient and family postoperatively and they understand they are to return to the nearest emergency department or contact the orthopedic clinic should signs or symptoms arise.  Please note there was no acute concern postoperatively.    Rufina Falco, D.O.  03/26/23 18:11  [facility]  Please call with any questions/concerns     This note has been created with voice recognition software.  Please excuse any errors in transcription.  Occasional wrong word or sound alike substitutions may have occurred due to the inherent limitations of voice recognition software.  Please read the chart carefully and recognize using context with the substitutions may have occurred.

## 2023-03-26 NOTE — Discharge Instructions (Signed)
Orthopaedic Discharge Instructions:    Non weightbearing to the operative extremity     Maintain splint/dressing until follow-up. Keep splint/dressing clean and dry. Call the office with any questions or problems.    Ice and elevate to alleviate pain and swelling    Take pain medication as prescribed    If patient experiences any pain and proportion, pain not alleviated by ice elevation analgesics, discoloration of the fingers, paralysis or paresthesias he is to return to the nearest emergency department or call the orthopedics clinic.    Follow-up in the orthopedics clinic at your scheduled appointment in approximately 2 weeks.  Please call the office to confirm your appointment.  Please also call with any questions or concerns.      [facility]  [address], [place], [place] [address]  [phone]

## 2023-03-26 NOTE — Anesthesia Preprocedure Evaluation (Signed)
ANESTHESIA PRE-OP EVALUATION  Planned Procedure: OPEN REDUCTION INTERNAL FIXATION LEFT RADIAL SHAFT USING SYNTHES IMPLANTS (Left: Wrist)  Review of Systems     anesthesia history negative               Pulmonary  negative pulmonary ROS,    Cardiovascular     Exercise Tolerance: > or = 4 METS        GI/Hepatic/Renal   negative GI/hepatic/renal ROS,         Endo/Other   neg endo/other ROS,       Neuro/Psych/MS   negative neuro/psych ROS,      Cancer                        Physical Assessment      Airway       Mallampati: II                  Dental                    Pulmonary    Breath sounds clear to auscultation       Cardiovascular    Rhythm: regular  Rate: Normal       Other findings              Plan  ASA 1     Planned anesthesia type: general     general anesthesia with laryngeal mask airway                        Anesthesia issues/risks discussed are: PONV, Nerve Injuries, Cardiac Events/MI, Sore Throat, Stroke, Aspiration and Dental Injuries.  Anesthetic plan and risks discussed with patient  signed consent obtained          Patient's NPO status is appropriate for Anesthesia.

## 2023-03-27 DIAGNOSIS — S52352A Displaced comminuted fracture of shaft of radius, left arm, initial encounter for closed fracture: Secondary | ICD-10-CM

## 2023-03-27 DIAGNOSIS — W1830XA Fall on same level, unspecified, initial encounter: Secondary | ICD-10-CM

## 2023-04-01 DIAGNOSIS — R9431 Abnormal electrocardiogram [ECG] [EKG]: Secondary | ICD-10-CM

## 2023-04-01 DIAGNOSIS — Z01810 Encounter for preprocedural cardiovascular examination: Secondary | ICD-10-CM

## 2023-04-01 LAB — ECG 12 LEAD
Atrial Rate: 73 {beats}/min
Calculated P Axis: 50 degrees
Calculated R Axis: 56 degrees
Calculated T Axis: -6 degrees
PR Interval: 116 ms
QRS Duration: 78 ms
QT Interval: 370 ms
QTC Calculation: 407 ms
Ventricular rate: 73 {beats}/min
